# Patient Record
Sex: Male | Born: 1995 | Race: White | Hispanic: No | Marital: Single | Smoking: Never smoker
Health system: Southern US, Community
[De-identification: ages and names within clinical notes are randomized; demographics above are authoritative.]

---
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
## 2018-04-27 IMAGING — CR DG CHEST 2V
2 series · 2 of 2 positions shown · non-contrast
Comparison: None.

CLINICAL DATA: Productive cough in general chest discomfort with
fever.

EXAM:
CHEST  2 VIEW

[w chest pa]
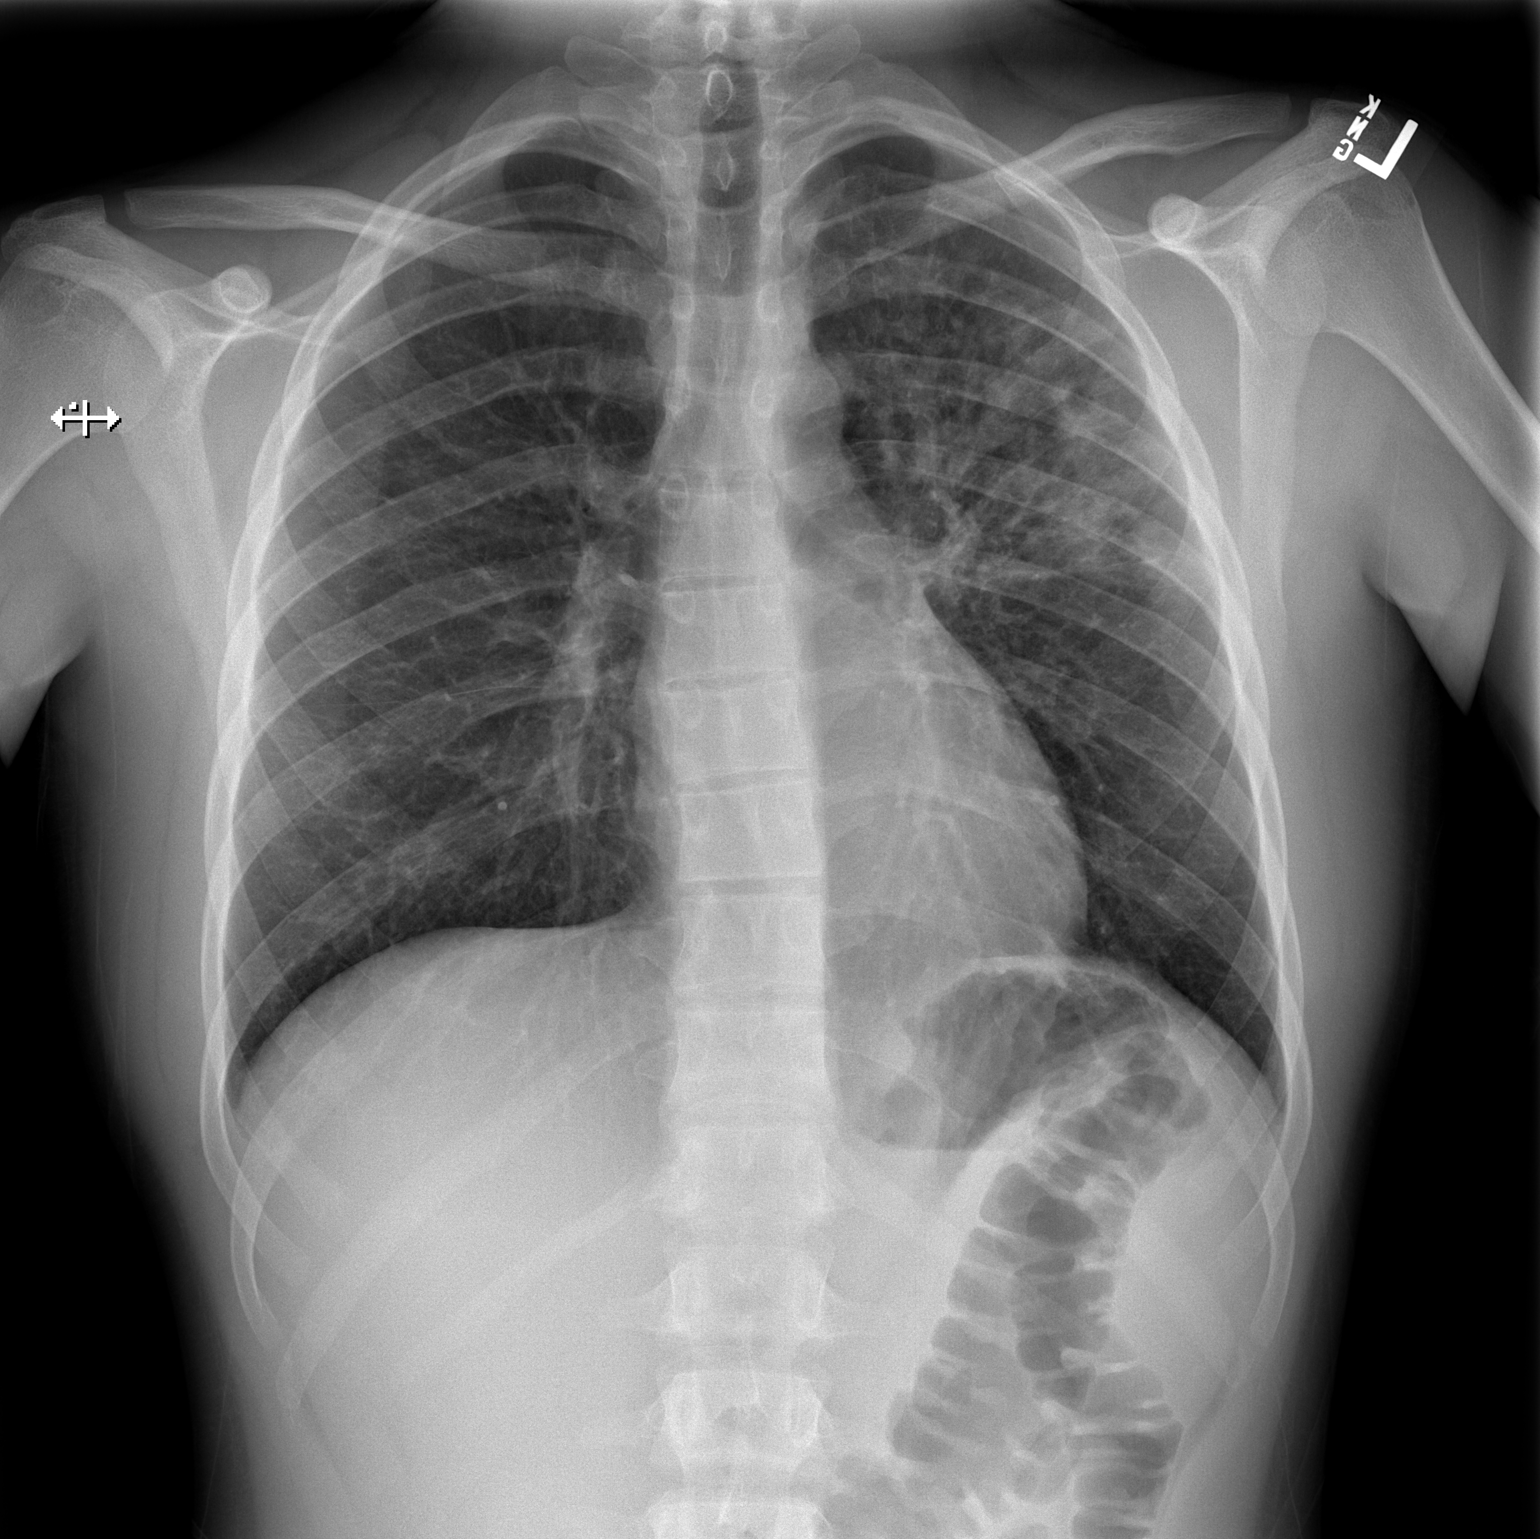

[w chest lat]
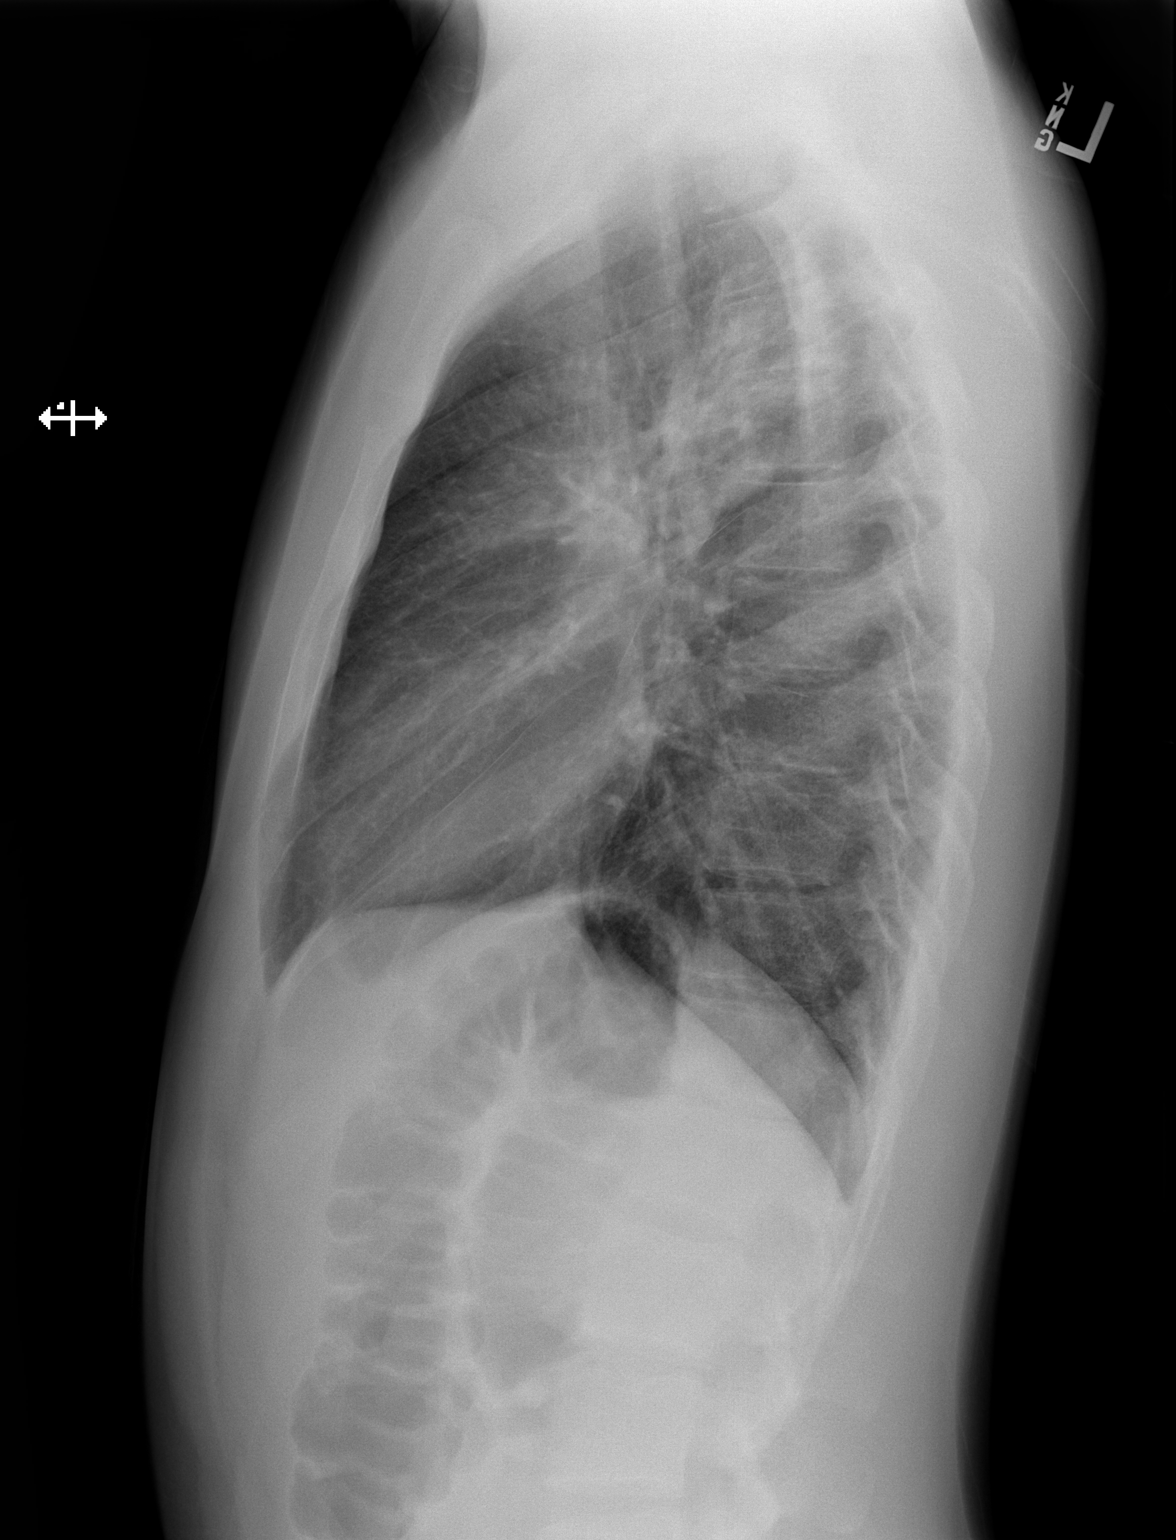

[2 of 2 positions shown; findings below may reference images not displayed]

FINDINGS: The cardiomediastinal silhouette is within normal limits. There is
patchy airspace opacity in the left upper lobe. Right lung is clear.
No pleural effusion or pneumothorax is identified. No acute osseous
abnormality is seen.
IMPRESSION: Left upper lobe pneumonia.
